# Patient Record
Sex: Male | Born: 1995 | Race: White | Hispanic: No | Marital: Single | Smoking: Never smoker
Health system: Southern US, Community
[De-identification: ages and names within clinical notes are randomized; demographics above are authoritative.]

---
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
## 2015-09-18 DIAGNOSIS — M79674 Pain in right toe(s): Secondary | ICD-10-CM | POA: Diagnosis not present

## 2015-09-18 DIAGNOSIS — S43432D Superior glenoid labrum lesion of left shoulder, subsequent encounter: Secondary | ICD-10-CM | POA: Diagnosis not present

## 2015-09-23 DIAGNOSIS — S4352XA Sprain of left acromioclavicular joint, initial encounter: Secondary | ICD-10-CM | POA: Diagnosis not present

## 2015-09-26 DIAGNOSIS — Z23 Encounter for immunization: Secondary | ICD-10-CM | POA: Diagnosis not present

## 2015-09-26 DIAGNOSIS — S4352XA Sprain of left acromioclavicular joint, initial encounter: Secondary | ICD-10-CM | POA: Diagnosis not present

## 2015-09-30 DIAGNOSIS — S4352XA Sprain of left acromioclavicular joint, initial encounter: Secondary | ICD-10-CM | POA: Diagnosis not present

## 2015-10-03 DIAGNOSIS — S4352XA Sprain of left acromioclavicular joint, initial encounter: Secondary | ICD-10-CM | POA: Diagnosis not present

## 2015-10-04 DIAGNOSIS — S4352XA Sprain of left acromioclavicular joint, initial encounter: Secondary | ICD-10-CM | POA: Diagnosis not present

## 2015-10-14 DIAGNOSIS — S4352XA Sprain of left acromioclavicular joint, initial encounter: Secondary | ICD-10-CM | POA: Diagnosis not present

## 2015-10-14 DIAGNOSIS — Z4789 Encounter for other orthopedic aftercare: Secondary | ICD-10-CM | POA: Diagnosis not present

## 2015-10-14 DIAGNOSIS — S43432D Superior glenoid labrum lesion of left shoulder, subsequent encounter: Secondary | ICD-10-CM | POA: Diagnosis not present

## 2015-10-16 DIAGNOSIS — S4352XA Sprain of left acromioclavicular joint, initial encounter: Secondary | ICD-10-CM | POA: Diagnosis not present

## 2015-10-18 DIAGNOSIS — S4352XA Sprain of left acromioclavicular joint, initial encounter: Secondary | ICD-10-CM | POA: Diagnosis not present

## 2016-03-17 DIAGNOSIS — M9903 Segmental and somatic dysfunction of lumbar region: Secondary | ICD-10-CM | POA: Diagnosis not present

## 2016-03-17 DIAGNOSIS — M9902 Segmental and somatic dysfunction of thoracic region: Secondary | ICD-10-CM | POA: Diagnosis not present

## 2016-03-17 DIAGNOSIS — M9904 Segmental and somatic dysfunction of sacral region: Secondary | ICD-10-CM | POA: Diagnosis not present

## 2016-03-17 DIAGNOSIS — M5408 Panniculitis affecting regions of neck and back, sacral and sacrococcygeal region: Secondary | ICD-10-CM | POA: Diagnosis not present

## 2016-05-19 DIAGNOSIS — S0232XD Fracture of orbital floor, left side, subsequent encounter for fracture with routine healing: Secondary | ICD-10-CM | POA: Diagnosis not present

## 2016-10-05 DIAGNOSIS — M5408 Panniculitis affecting regions of neck and back, sacral and sacrococcygeal region: Secondary | ICD-10-CM | POA: Diagnosis not present

## 2016-10-05 DIAGNOSIS — M9903 Segmental and somatic dysfunction of lumbar region: Secondary | ICD-10-CM | POA: Diagnosis not present

## 2016-10-05 DIAGNOSIS — M9902 Segmental and somatic dysfunction of thoracic region: Secondary | ICD-10-CM | POA: Diagnosis not present

## 2016-10-05 DIAGNOSIS — M9904 Segmental and somatic dysfunction of sacral region: Secondary | ICD-10-CM | POA: Diagnosis not present

## 2017-03-26 DIAGNOSIS — L720 Epidermal cyst: Secondary | ICD-10-CM | POA: Diagnosis not present

## 2017-03-26 DIAGNOSIS — D171 Benign lipomatous neoplasm of skin and subcutaneous tissue of trunk: Secondary | ICD-10-CM | POA: Diagnosis not present
